# Patient Record
Sex: Male | Born: 1975 | Race: Black or African American | Hispanic: No | Marital: Single | Smoking: Current every day smoker
Health system: Southern US, Community
[De-identification: ages and names within clinical notes are randomized; demographics above are authoritative.]

---

## 2019-11-21 ENCOUNTER — Emergency Department: Payer: Self-pay

## 2019-11-21 ENCOUNTER — Other Ambulatory Visit: Payer: Self-pay

## 2019-11-21 ENCOUNTER — Emergency Department
Admission: EM | Admit: 2019-11-21 | Discharge: 2019-11-21 | Disposition: A | Discharge status: Home / Self Care | Payer: Self-pay | Attending: Emergency Medicine | Admitting: Emergency Medicine

## 2019-11-21 DIAGNOSIS — Y990 Civilian activity done for income or pay: Secondary | ICD-10-CM | POA: Insufficient documentation

## 2019-11-21 DIAGNOSIS — Y9389 Activity, other specified: Secondary | ICD-10-CM | POA: Insufficient documentation

## 2019-11-21 DIAGNOSIS — X500XXA Overexertion from strenuous movement or load, initial encounter: Secondary | ICD-10-CM | POA: Insufficient documentation

## 2019-11-21 DIAGNOSIS — S46911A Strain of unspecified muscle, fascia and tendon at shoulder and upper arm level, right arm, initial encounter: Secondary | ICD-10-CM | POA: Insufficient documentation

## 2019-11-21 DIAGNOSIS — Y929 Unspecified place or not applicable: Secondary | ICD-10-CM | POA: Insufficient documentation

## 2019-11-21 MED ORDER — IBUPROFEN 600 MG PO TABS: 600.0000 mg | ORAL_TABLET | Freq: Three times a day (TID) | ORAL | 0 refills | Status: DC | PRN

## 2019-11-21 MED ORDER — LIDOCAINE 5 % EX PTCH
1.0000 | MEDICATED_PATCH | CUTANEOUS | Status: DC
  Administered 2019-11-21: 1 via TRANSDERMAL
  Filled 2019-11-21: qty 1

## 2019-11-21 MED ORDER — TRAMADOL HCL 50 MG PO TABS
50.0000 mg | ORAL_TABLET | Freq: Once | ORAL | Status: AC
  Administered 2019-11-21: 50 mg via ORAL
  Filled 2019-11-21: qty 1

## 2019-11-21 MED ORDER — IBUPROFEN 600 MG PO TABS
600.0000 mg | ORAL_TABLET | Freq: Once | ORAL | Status: AC
  Administered 2019-11-21: 600 mg via ORAL
  Filled 2019-11-21: qty 1

## 2019-11-21 MED ORDER — TRAMADOL HCL 50 MG PO TABS: 50.0000 mg | ORAL_TABLET | Freq: Four times a day (QID) | ORAL | 0 refills | Status: DC | PRN

## 2019-11-21 NOTE — ED Provider Notes (Signed)
[facility] Emergency Department Provider Note   ____________________________________________   First MD Initiated Contact with Patient 11/21/19 [phone]     (approximate)  I have reviewed the triage vital signs and the nursing notes.   HISTORY  Chief Complaint Shoulder Pain    HPI Alan Mcknight is a 44 y.o. male patient complain of right shoulder pain secondary to repetitive heavy lifting which occurred at work.  Onset of complaint was yesterday.  Patient stated decreased range of motion with abduction and overhead reaching.  Patient points to the [facility] joint as a source of pain.  Patient rates the pain as a 10/10.  Patient described pain as "achy".  No palliative measure for complaint.  Patient is right-hand dominant.      History reviewed. No pertinent past medical history.  There are no problems to display for this patient.   History reviewed. No pertinent surgical history.  Prior to Admission medications   Medication Sig Start Date End Date Taking? Authorizing Provider  ibuprofen (ADVIL) 600 MG tablet Take 1 tablet (600 mg total) by mouth every 8 (eight) hours as needed. 11/21/19   Sable Feil, PA-C  traMADol (ULTRAM) 50 MG tablet Take 1 tablet (50 mg total) by mouth every 6 (six) hours as needed. 11/21/19 11/20/20  Sable Feil, PA-C    Allergies Patient has no known allergies.  No family history on file.  Social History Social History   Tobacco Use  . Smoking status: Not on file  Substance Use Topics  . Alcohol use: Not on file  . Drug use: Not on file    Review of Systems Constitutional: No fever/chills Eyes: No visual changes. ENT: No sore throat. Cardiovascular: Denies chest pain. Respiratory: Denies shortness of breath. Gastrointestinal: No abdominal pain.  No nausea, no vomiting.  No diarrhea.  No constipation. Genitourinary: Negative for dysuria. Musculoskeletal: Right shoulder pain. Skin: Negative for  rash. Neurological: Negative for headaches, focal weakness or numbness.  ____________________________________________   PHYSICAL EXAM:  VITAL SIGNS: ED Triage Vitals  Enc Vitals Group     BP 11/21/19 0817 (!) 150/99     Pulse Rate 11/21/19 0817 (!) 59     Resp 11/21/19 0817 16     Temp 11/21/19 0817 98.1 F (36.7 C)     Temp Source 11/21/19 0817 Oral     SpO2 11/21/19 0817 96 %     Weight 11/21/19 0818 190 lb (86.2 kg)     Height 11/21/19 0818 5\' 8"  (1.727 m)     Head Circumference --      Peak Flow --      Pain Score 11/21/19 0818 10     Pain Loc --      Pain Edu? --      Excl. in [place]? --     Constitutional: Alert and oriented. Well appearing and in no acute distress. Neck:No cervical spine tenderness to palpation. Hematological/Lymphatic/Immunilogical: No cervical lymphadenopathy. Cardiovascular: Bradycardic, regular rhythm. Grossly normal heart sounds.  Good peripheral circulation.  Elevated blood pressure Respiratory: Normal respiratory effort.  No retractions. Lungs CTAB. Musculoskeletal: No obvious deformity to the right shoulder.  Patient has decreased range of motion with abduction and overhead reaching.  Patient is moderate guarding palpation the [place] joint.   Neurologic:  Normal speech and language. No gross focal neurologic deficits are appreciated. No gait instability. Skin:  Skin is warm, dry and intact. No rash noted. Psychiatric: Mood and affect are normal. Speech and behavior are  normal.  ____________________________________________   LABS (all labs ordered are listed, but only abnormal results are displayed)  Labs Reviewed - No data to display ____________________________________________  EKG   ____________________________________________  RADIOLOGY  ED MD interpretation:    Official radiology report(s): No results found.  ____________________________________________   PROCEDURES  Procedure(s) performed (including Critical  Care):  Procedures   ____________________________________________   INITIAL IMPRESSION / ASSESSMENT AND PLAN / ED COURSE  As part of my medical decision making, I reviewed the following data within the electronic MEDICAL RECORD NUMBER     Patient presents with right shoulder pain secondary to heavy lifting.  Discussed negative x-ray findings with patient.  Patient physical exam is consistent with shoulder strain.  Patient placed in arm sling and given discharge care instruction.  Patient advised on drug effects of pain medications.  Patient advised follow orthopedic if there is no improvement in 1 week.    Alan Mcknight was evaluated in Emergency Department on 11/21/2019 for the symptoms described in the history of present illness. He was evaluated in the context of the global COVID-19 pandemic, which necessitated consideration that the patient might be at risk for infection with the SARS-CoV-2 virus that causes COVID-19. Institutional protocols and algorithms that pertain to the evaluation of patients at risk for COVID-19 are in a state of rapid change based on information released by regulatory bodies including the CDC and federal and state organizations. These policies and algorithms were followed during the patient's care in the ED.       ____________________________________________   FINAL CLINICAL IMPRESSION(S) / ED DIAGNOSES  Final diagnoses:  Strain of right shoulder, initial encounter     ED Discharge Orders         Ordered    traMADol (ULTRAM) 50 MG tablet  Every 6 hours PRN     11/21/19 1007    ibuprofen (ADVIL) 600 MG tablet  Every 8 hours PRN     11/21/19 1007           Note:  This document was prepared using Dragon voice recognition software and may include unintentional dictation errors.    Joni Reining, PA-C 11/21/19 1011    Shaune Pollack, MD 11/21/19 2038

## 2019-11-21 NOTE — ED Triage Notes (Signed)
Reports onset of right shoulder pain while at work Tuesday picking heavy item up. Wants to file workers comp. Pt alert and oriented X4, cooperative, RR even and unlabored, color WNL. Pt in NAD.

## 2019-11-21 NOTE — Discharge Instructions (Signed)
Follow discharge care instruction Wear arm sling for 2 to 3 days.  Follow-up with orthopedic listed on your discharge care instructed and there is no improvement in 5 days.

## 2019-11-21 NOTE — ED Notes (Signed)
See triage note Presents with right shoulder pain  States he developed pain after lifting something heavy at work 2 days.ago. no deformity noted  States min relief with ibu  Last time he took some was last night

## 2019-11-21 NOTE — ED Notes (Signed)
Explained the wait to pt's family  Pt is asleep at present

## 2020-02-18 ENCOUNTER — Emergency Department: Payer: Self-pay

## 2020-02-18 ENCOUNTER — Emergency Department
Admission: EM | Admit: 2020-02-18 | Discharge: 2020-02-18 | Disposition: A | Discharge status: Home / Self Care | Payer: Self-pay | Attending: Emergency Medicine | Admitting: Emergency Medicine

## 2020-02-18 ENCOUNTER — Other Ambulatory Visit: Payer: Self-pay

## 2020-02-18 DIAGNOSIS — M542 Cervicalgia: Secondary | ICD-10-CM | POA: Insufficient documentation

## 2020-02-18 DIAGNOSIS — F17200 Nicotine dependence, unspecified, uncomplicated: Secondary | ICD-10-CM | POA: Insufficient documentation

## 2020-02-18 DIAGNOSIS — M549 Dorsalgia, unspecified: Secondary | ICD-10-CM | POA: Insufficient documentation

## 2020-02-18 DIAGNOSIS — G8929 Other chronic pain: Secondary | ICD-10-CM | POA: Insufficient documentation

## 2020-02-18 MED ORDER — TRAMADOL HCL 50 MG PO TABS: 50.0000 mg | ORAL_TABLET | Freq: Four times a day (QID) | ORAL | 0 refills | Status: AC | PRN

## 2020-02-18 MED ORDER — IBUPROFEN 600 MG PO TABS: 600.0000 mg | ORAL_TABLET | Freq: Four times a day (QID) | ORAL | 0 refills | Status: AC | PRN

## 2020-02-18 MED ORDER — ORPHENADRINE CITRATE 30 MG/ML IJ SOLN
60.0000 mg | Freq: Two times a day (BID) | INTRAMUSCULAR | Status: DC
  Administered 2020-02-18: 60 mg via INTRAMUSCULAR
  Filled 2020-02-18: qty 2

## 2020-02-18 MED ORDER — CYCLOBENZAPRINE HCL 5 MG PO TABS: ORAL_TABLET | ORAL | 0 refills | Status: AC

## 2020-02-18 MED ORDER — LIDOCAINE 5 % EX PTCH
1.0000 | MEDICATED_PATCH | CUTANEOUS | Status: DC
  Administered 2020-02-18: 1 via TRANSDERMAL
  Filled 2020-02-18: qty 1

## 2020-02-18 MED ORDER — LIDOCAINE 5 % EX PTCH: 1.0000 | MEDICATED_PATCH | CUTANEOUS | 0 refills | Status: AC

## 2020-02-18 MED ORDER — OXYCODONE-ACETAMINOPHEN 5-325 MG PO TABS
1.0000 | ORAL_TABLET | Freq: Once | ORAL | Status: AC
  Administered 2020-02-18: 1 via ORAL
  Filled 2020-02-18: qty 1

## 2020-02-18 MED ORDER — KETOROLAC TROMETHAMINE 30 MG/ML IJ SOLN
30.0000 mg | Freq: Once | INTRAMUSCULAR | Status: AC
  Administered 2020-02-18: 30 mg via INTRAMUSCULAR
  Filled 2020-02-18: qty 1
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
## 2020-02-18 NOTE — ED Triage Notes (Signed)
Pt states neck and back pain since Sunday. Heavy lifting at job. A&O, ambulatory.

## 2020-02-27 ENCOUNTER — Emergency Department: Payer: Self-pay

## 2020-02-27 ENCOUNTER — Other Ambulatory Visit: Payer: Self-pay

## 2020-02-27 ENCOUNTER — Emergency Department
Admission: EM | Admit: 2020-02-27 | Discharge: 2020-02-27 | Disposition: A | Discharge status: Home / Self Care | Payer: Self-pay | Attending: Emergency Medicine | Admitting: Emergency Medicine

## 2020-02-27 DIAGNOSIS — R519 Headache, unspecified: Secondary | ICD-10-CM | POA: Insufficient documentation

## 2020-02-27 DIAGNOSIS — S161XXA Strain of muscle, fascia and tendon at neck level, initial encounter: Secondary | ICD-10-CM

## 2020-02-27 DIAGNOSIS — F17200 Nicotine dependence, unspecified, uncomplicated: Secondary | ICD-10-CM | POA: Insufficient documentation

## 2020-02-27 DIAGNOSIS — M542 Cervicalgia: Secondary | ICD-10-CM | POA: Insufficient documentation

## 2020-02-27 LAB — CBC WITH DIFFERENTIAL/PLATELET
Abs Immature Granulocytes: 0.03 10*3/uL (ref 0.00–0.07)
Basophils Absolute: 0 10*3/uL (ref 0.0–0.1)
Basophils Relative: 0 %
Eosinophils Absolute: 0.2 10*3/uL (ref 0.0–0.5)
Eosinophils Relative: 3 %
HCT: 44.7 % (ref 39.0–52.0)
Hemoglobin: 14.7 g/dL (ref 13.0–17.0)
Immature Granulocytes: 1 %
Lymphocytes Relative: 34 %
Lymphs Abs: 1.9 10*3/uL (ref 0.7–4.0)
MCH: 28.1 pg (ref 26.0–34.0)
MCHC: 32.9 g/dL (ref 30.0–36.0)
MCV: 85.3 fL (ref 80.0–100.0)
Monocytes Absolute: 0.5 10*3/uL (ref 0.1–1.0)
Monocytes Relative: 9 %
Neutro Abs: 2.9 10*3/uL (ref 1.7–7.7)
Neutrophils Relative %: 53 %
Platelets: 273 10*3/uL (ref 150–400)
RBC: 5.24 MIL/uL (ref 4.22–5.81)
RDW: 12.8 % (ref 11.5–15.5)
WBC: 5.5 10*3/uL (ref 4.0–10.5)
nRBC: 0 % (ref 0.0–0.2)

## 2020-02-27 LAB — COMPREHENSIVE METABOLIC PANEL
ALT: 26 U/L (ref 0–44)
AST: 18 U/L (ref 15–41)
Albumin: 4.3 g/dL (ref 3.5–5.0)
Alkaline Phosphatase: 45 U/L (ref 38–126)
Anion gap: 7 (ref 5–15)
BUN: 11 mg/dL (ref 6–20)
CO2: 29 mmol/L (ref 22–32)
Calcium: 9 mg/dL (ref 8.9–10.3)
Chloride: 102 mmol/L (ref 98–111)
Creatinine, Ser: 0.99 mg/dL (ref 0.61–1.24)
GFR calc Af Amer: >60 mL/min (ref >60)
GFR calc non Af Amer: >60 mL/min (ref >60)
Glucose, Bld: 96 mg/dL (ref 70–99)
Potassium: 4.1 mmol/L (ref 3.5–5.1)
Sodium: 138 mmol/L (ref 135–145)
Total Bilirubin: 0.9 mg/dL (ref 0.3–1.2)
Total Protein: 7.4 g/dL (ref 6.5–8.1)

## 2020-02-27 MED ORDER — METHYLPREDNISOLONE SODIUM SUCC 125 MG IJ SOLR
125.0000 mg | Freq: Once | INTRAMUSCULAR | Status: AC
  Administered 2020-02-27: 125 mg via INTRAVENOUS
  Filled 2020-02-27: qty 2

## 2020-02-27 MED ORDER — KETOROLAC TROMETHAMINE 30 MG/ML IJ SOLN
30.0000 mg | Freq: Once | INTRAMUSCULAR | Status: AC
  Administered 2020-02-27: 30 mg via INTRAVENOUS
  Filled 2020-02-27: qty 1

## 2020-02-27 MED ORDER — PREDNISONE 10 MG PO TABS
10.0000 mg | ORAL_TABLET | Freq: Every day | ORAL | 0 refills | Status: AC
Start: 2020-02-27 — End: ?

## 2020-02-27 MED ORDER — SODIUM CHLORIDE 0.9 % IV BOLUS
1000.0000 mL | Freq: Once | INTRAVENOUS | Status: AC
  Administered 2020-02-27: 1000 mL via INTRAVENOUS

## 2020-02-27 MED ORDER — HYDROCODONE-ACETAMINOPHEN 5-325 MG PO TABS: 1.0000 | ORAL_TABLET | ORAL | 0 refills | Status: AC | PRN

## 2020-02-27 MED ORDER — METOCLOPRAMIDE HCL 5 MG/ML IJ SOLN
10.0000 mg | Freq: Once | INTRAMUSCULAR | Status: AC
  Administered 2020-02-27: 10 mg via INTRAVENOUS
  Filled 2020-02-27: qty 2

## 2020-02-27 MED ORDER — DIPHENHYDRAMINE HCL 50 MG/ML IJ SOLN
50.0000 mg | Freq: Once | INTRAMUSCULAR | Status: AC
  Administered 2020-02-27: 50 mg via INTRAVENOUS
  Filled 2020-02-27: qty 1

## 2020-02-27 MED ORDER — IOHEXOL 350 MG/ML SOLN
75.0000 mL | Freq: Once | INTRAVENOUS | Status: AC | PRN
  Administered 2020-02-27: 75 mL via INTRAVENOUS
  Filled 2020-02-27: qty 75

## 2020-02-27 NOTE — ED Notes (Signed)
E-signature not working at this time. Pt verbalized understanding of D/C instructions, prescriptions and follow up care with no further questions at this time. Pt in NAD and ambulatory at time of D/C.  

## 2020-02-27 NOTE — ED Provider Notes (Signed)
[facility] Emergency Department Provider Note  Time seen: 3:51 PM  I have reviewed the triage vital signs and the nursing notes.   HISTORY  Chief Complaint Headache   HPI Alan Mcknight is a 44 y.o. male with no significant past medical history presents to the emergency department for continued headache and neck pain.  According to the patient he was seen in the emergency department approximately 1 week ago for the same, had a CT scan of the head that was negative was given a muscle relaxer which seemed to help the pain.  Patient states the muscle relaxer no longer is helping the pain.  He states the pain started 1 day after visiting a chiropractor.  The pain is mostly in the right neck/right base of the skull radiating up into his head.  Denies any weakness numbness slurred speech or confusion.  No history of headaches previously per patient.  Patient states the pain is much worse with neck movement.   History reviewed. No pertinent past medical history.  There are no problems to display for this patient.   History reviewed. No pertinent surgical history.  Prior to Admission medications   Medication Sig Start Date End Date Taking? Authorizing Provider  cyclobenzaprine (FLEXERIL) 5 MG tablet Take 1-2 tablets 3 times daily as needed 02/18/20   Enid Derry, PA-C  ibuprofen (ADVIL) 600 MG tablet Take 1 tablet (600 mg total) by mouth every 6 (six) hours as needed. 02/18/20   Enid Derry, PA-C  lidocaine (LIDODERM) 5 % Place 1 patch onto the skin daily. Remove & Discard patch within 12 hours or as directed by MD 02/18/20   Enid Derry, PA-C  traMADol (ULTRAM) 50 MG tablet Take 1 tablet (50 mg total) by mouth every 6 (six) hours as needed. 02/18/20 02/17/21  Enid Derry, PA-C    No Known Allergies  History reviewed. No pertinent family history.  Social History Social History   Tobacco Use  . Smoking status: Current Every Day Smoker  Substance Use Topics   . Alcohol use: Yes  . Drug use: Not on file    Review of Systems Constitutional: Negative for fever. Cardiovascular: Negative for chest pain. Respiratory: Negative for shortness of breath. Gastrointestinal: Negative for abdominal pain, vomiting Musculoskeletal: Positive for headache.  Positive for right neck pain. Neurological: Positive headache. All other ROS negative  ____________________________________________   PHYSICAL EXAM:  VITAL SIGNS: ED Triage Vitals  Enc Vitals Group     BP 02/27/20 1255 (!) 141/88     Pulse Rate 02/27/20 1255 (!) 53     Resp 02/27/20 1255 16     Temp 02/27/20 1255 98.5 F (36.9 C)     Temp Source 02/27/20 1255 Oral     SpO2 02/27/20 1255 99 %     Weight 02/27/20 1252 178 lb (80.7 kg)     Height 02/27/20 1252 5\' 9"  (1.753 m)     Head Circumference --      Peak Flow --      Pain Score 02/27/20 1252 10     Pain Loc --      Pain Edu? --      Excl. in [place]? --     Constitutional: Alert and oriented. Well appearing and in no distress. Eyes: Normal exam, no photophobia. ENT      Head: Normocephalic and atraumatic.      Mouth/Throat: Mucous membranes are moist. Cardiovascular: Normal rate, regular rhythm. Respiratory: Normal respiratory effort without tachypnea nor retractions.  Breath sounds are clear  Gastrointestinal: Soft and nontender. No distention.   Musculoskeletal: Moderate tenderness of the right paraspinal cervical muscles extending into the right occipital scalp. Neurologic:  Normal speech and language. No gross focal neurologic deficits.  5/5 motor no extremities.  Normal cranial nerves. Psychiatric: Mood and affect are normal.      RADIOLOGY  CTAs are negative for acute abnormality.  ____________________________________________   INITIAL IMPRESSION / ASSESSMENT AND PLAN / ED COURSE  Pertinent labs & imaging results that were available during my care of the patient were reviewed by me and considered in my medical decision  making (see chart for details).   Patient presents to the emergency department for continued headache and right neck pain.  States the neck pain is more of a shooting type pain in the right neck going into the right head.  Differential would include muscular strain, tension headache, given this occurred 1 day after her chiropractor visit would also include vertebral artery dissection, less likely aneurysmal issue.  We will obtain CT angio of the neck and head as precaution.  We will treat with migraine cocktail plus Solu-Medrol continue to closely monitor.  Patient agreeable to plan.  States headache is significant at this time.  CTA imaging is negative.  Patient states he is feeling much better for medications.  Will discharge with short course of pain medication as well as a 10-day prednisone taper.  Alan Mcknight was evaluated in Emergency Department on 02/27/2020 for the symptoms described in the history of present illness. He was evaluated in the context of the global COVID-19 pandemic, which necessitated consideration that the patient might be at risk for infection with the SARS-CoV-2 virus that causes COVID-19. Institutional protocols and algorithms that pertain to the evaluation of patients at risk for COVID-19 are in a state of rapid change based on information released by regulatory bodies including the CDC and federal and state organizations. These policies and algorithms were followed during the patient's care in the ED.  ____________________________________________   FINAL CLINICAL IMPRESSION(S) / ED DIAGNOSES  Headache Neck pain   Minna Antis, MD 02/27/20 1754

## 2020-02-27 NOTE — ED Triage Notes (Signed)
Pt states his head is throbbing and that was here las week for same- pt states it has not gotten any better since- pt states he took a muscle relaxer and the pain went away but the pain came back

## 2020-02-27 NOTE — ED Notes (Signed)
Pt reports improvement of headache after medication.

## 2021-01-17 IMAGING — CT CT ANGIO HEAD
1 of 10 series · 6 of 33 positions shown · IV contrast (APPLIED)
Comparison: None.

CLINICAL DATA: Head and neck pain following chiropractor visit

EXAM:
CT ANGIOGRAPHY HEAD AND NECK
TECHNIQUE: Multidetector CT imaging of the head and neck was performed using
the standard protocol during bolus administration of intravenous
contrast. Multiplanar CT image reconstructions and MIPs were
obtained to evaluate the vascular anatomy. Carotid stenosis
measurements (when applicable) are obtained utilizing NASCET
criteria, using the distal internal carotid diameter as the
denominator.
CONTRAST:  75mL OMNIPAQUE IOHEXOL 350 MG/ML SOLN

[Series 8: ax thin · axial · 0.44mm/px · z∈[-373,-126]mm · 6 of 363 slices shown]
[im 52/363  soft-tissue]
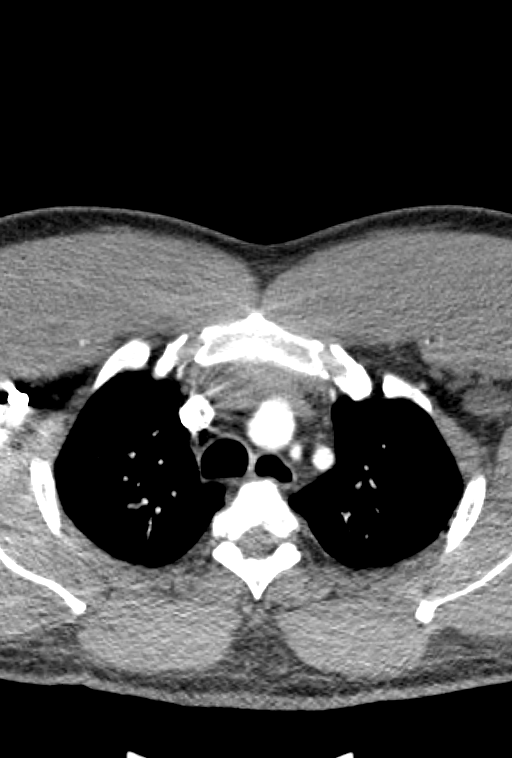
[im 104/363  bone]
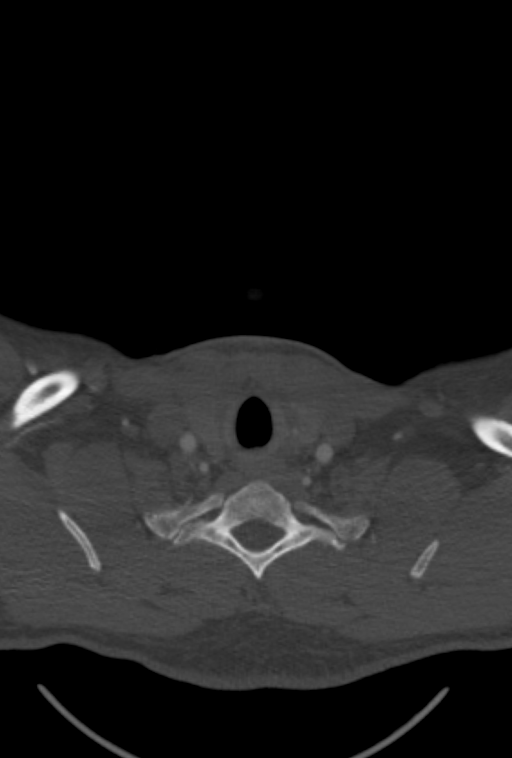
[im 156/363  soft-tissue]
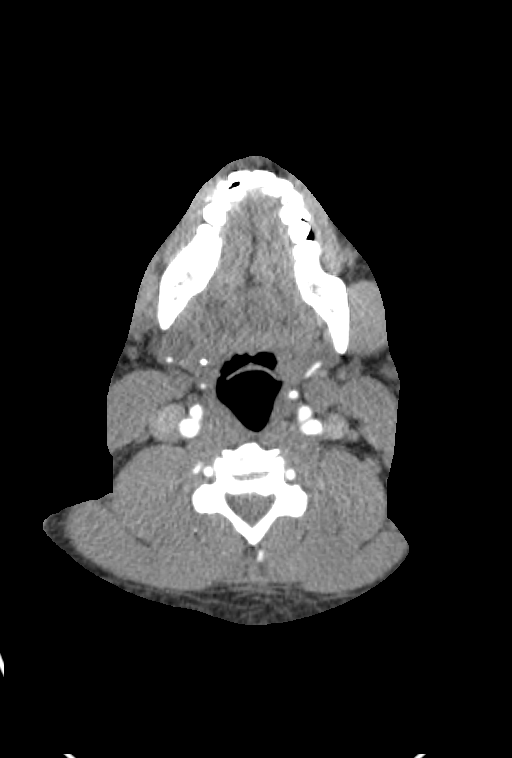
[im 207/363  bone]
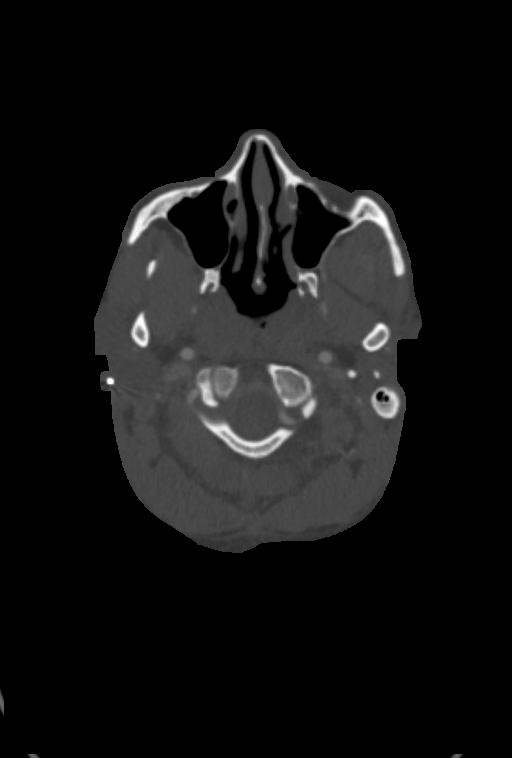
[im 259/363  soft-tissue]
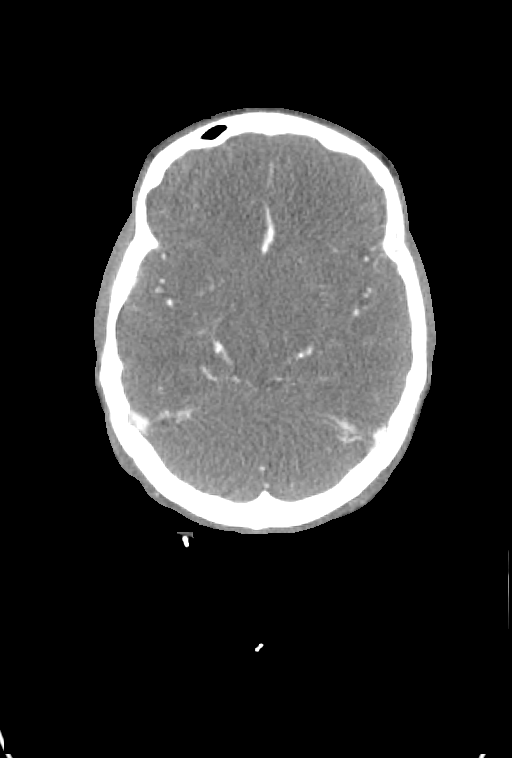
[im 311/363  bone]
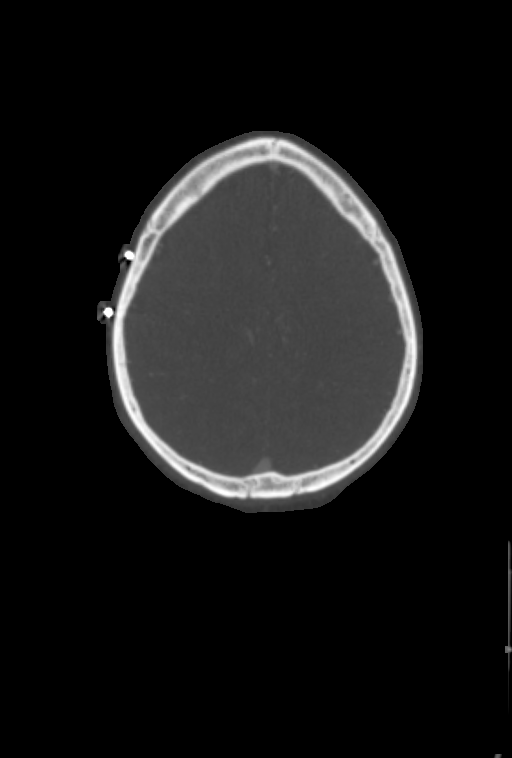

[6 of 33 positions shown; findings below may reference images not displayed]

FINDINGS: CT HEAD

Brain: There is no acute intracranial hemorrhage, mass effect, or
edema. Gray-white differentiation is preserved. There is no
extra-axial fluid collection. Ventricles and sulci are within normal
limits in size and configuration.

Vascular: No hyperdense vessel or unexpected calcification.

Skull: Calvarium is unremarkable.

Sinuses/Orbits: No acute finding.

Other: Scattered metallic fragments in the scalp with associated
streak artifact.

Review of the MIP images confirms the above findings

CTA NECK

Aortic arch: Great vessel origins are patent. There is direct origin
of the left vertebral artery from the arch.

Right carotid system: Patent. No measurable stenosis or evidence of
dissection.

Left carotid system: Patent. Mild eccentric noncalcified plaque at
the ICA origin causing minimal stenosis. No evidence of dissection.

Vertebral arteries: Patent and codominant. No measurable stenosis or
evidence dissection.

Skeleton: Degenerative changes of the cervical spine.

Other neck: No mass or adenopathy.  Scattered metallic fragments.

Upper chest: No apical lung mass.

Review of the MIP images confirms the above findings

CTA HEAD

Anterior circulation: Intracranial internal carotid arteries are
patent. Anterior cerebral arteries are patent. A small anterior
communicating artery is present. Middle cerebral arteries are
patent.

Posterior circulation: Intracranial vertebral arteries are patent.
Basilar artery is patent. Posterior cerebral arteries are patent.

Venous sinuses: Patent as allowed by contrast bolus timing.

Review of the MIP images confirms the above findings
IMPRESSION: No acute intracranial abnormality.

No large vessel occlusion, hemodynamically significant stenosis, or
evidence of dissection.

Noncalcified plaque at the left ICA origin causes minimal stenosis.
